# Patient Record
Sex: Female | Born: 1975 | Hispanic: Yes | Marital: Single | Smoking: Never smoker
Health system: Southern US, Community
[De-identification: ages and names within clinical notes are randomized; demographics above are authoritative.]

---

## 2013-12-26 ENCOUNTER — Emergency Department (HOSPITAL_COMMUNITY): Payer: Self-pay

## 2013-12-26 ENCOUNTER — Emergency Department (HOSPITAL_COMMUNITY)
Admission: EM | Admit: 2013-12-26 | Discharge: 2013-12-27 | Disposition: A | Discharge status: Home / Self Care | Payer: Self-pay | Attending: Emergency Medicine | Admitting: Emergency Medicine

## 2013-12-26 ENCOUNTER — Encounter (HOSPITAL_COMMUNITY): Payer: Self-pay

## 2013-12-26 DIAGNOSIS — R062 Wheezing: Secondary | ICD-10-CM

## 2013-12-26 DIAGNOSIS — J069 Acute upper respiratory infection, unspecified: Secondary | ICD-10-CM | POA: Insufficient documentation

## 2013-12-26 DIAGNOSIS — R079 Chest pain, unspecified: Secondary | ICD-10-CM

## 2013-12-26 LAB — CBC
HCT: 38.6 % (ref 36.0–46.0)
HEMOGLOBIN: 12.9 g/dL (ref 12.0–15.0)
MCH: 29.7 pg (ref 26.0–34.0)
MCHC: 33.4 g/dL (ref 30.0–36.0)
MCV: 88.9 fL (ref 78.0–100.0)
PLATELETS: 191 10*3/uL (ref 150–400)
RBC: 4.34 MIL/uL (ref 3.87–5.11)
RDW: 12.4 % (ref 11.5–15.5)
WBC: 8.3 10*3/uL (ref 4.0–10.5)

## 2013-12-26 LAB — BASIC METABOLIC PANEL
Anion gap: 19 — ABNORMAL HIGH (ref 5–15)
BUN: 6 mg/dL (ref 6–23)
CALCIUM: 9.4 mg/dL (ref 8.4–10.5)
CO2: 17 meq/L — AB (ref 19–32)
CREATININE: 0.39 mg/dL — AB (ref 0.50–1.10)
Chloride: 100 mEq/L (ref 96–112)
GFR calc Af Amer: >90 mL/min (ref >90)
GLUCOSE: 92 mg/dL (ref 70–99)
Potassium: 4 mEq/L (ref 3.7–5.3)
SODIUM: 136 meq/L — AB (ref 137–147)

## 2013-12-26 LAB — I-STAT TROPONIN, ED: TROPONIN I, POC: 0 ng/mL (ref 0.00–0.08)

## 2013-12-26 NOTE — ED Notes (Signed)
Pt presents with Left side chest pain radiating to Left arm, diaphoresis, SOB, and dizziness starting at 0700. Pt states it kept waking her up. Pt also c/o Left arm numbness, stroke screen negative in triage, no facial droop or arm drift noted, grips and strengths are equal bilaterally.

## 2013-12-27 LAB — I-STAT TROPONIN, ED: Troponin i, poc: 0 ng/mL (ref 0.00–0.08)

## 2013-12-27 MED ORDER — BENZONATATE 100 MG PO CAPS: 100.0000 mg | ORAL_CAPSULE | Freq: Three times a day (TID) | ORAL | Status: AC

## 2013-12-27 MED ORDER — BENZONATATE 100 MG PO CAPS
200.0000 mg | ORAL_CAPSULE | Freq: Once | ORAL | Status: AC
  Administered 2013-12-27: 200 mg via ORAL
  Filled 2013-12-27: qty 2

## 2013-12-27 MED ORDER — DIPHENHYDRAMINE HCL 25 MG PO CAPS
25.0000 mg | ORAL_CAPSULE | Freq: Once | ORAL | Status: AC
  Administered 2013-12-27: 25 mg via ORAL
  Filled 2013-12-27: qty 1

## 2013-12-27 MED ORDER — LEVALBUTEROL HCL 1.25 MG/0.5ML IN NEBU
2.5000 mg | INHALATION_SOLUTION | Freq: Once | RESPIRATORY_TRACT | Status: AC
  Administered 2013-12-27: 2.5 mg via RESPIRATORY_TRACT
  Filled 2013-12-27: qty 1

## 2013-12-27 MED ORDER — PREDNISONE 20 MG PO TABS: ORAL_TABLET | ORAL | Status: AC

## 2013-12-27 MED ORDER — LEVALBUTEROL TARTRATE 45 MCG/ACT IN AERO
2.0000 | INHALATION_SPRAY | Freq: Once | RESPIRATORY_TRACT | Status: AC
  Administered 2013-12-27: 2 via RESPIRATORY_TRACT
  Filled 2013-12-27: qty 15

## 2013-12-27 MED ORDER — PREDNISONE 20 MG PO TABS
60.0000 mg | ORAL_TABLET | Freq: Once | ORAL | Status: AC
  Administered 2013-12-27: 60 mg via ORAL
  Filled 2013-12-27: qty 3

## 2013-12-27 NOTE — ED Notes (Signed)
Neb in progress, tolerating, NAD, calm.

## 2013-12-27 NOTE — Discharge Instructions (Signed)
Asma, broncoespasmo agudo °(Asthma, Acute Bronchospasm) °El broncoespasmo agudo causado por el asma también se conoce como crisis de asma. Broncoespasmo significa que las vías respiratorias se han estrechado. La causa del estrechamiento es la inflamación y la constricción de los músculos de las vías respiratorias (bronquios) que se encuentran en los pulmones. Esto puede dificultar la respiración o provocarle sibilancias y tos. °CAUSAS °Los desencadenantes posibles son: °· La caspa que eliminan los animales de la piel, el pelo o las plumas de los animales. °· Los ácaros que se encuentran en el polvo de la casa. °· Cucarachas. °· El polen de los árboles o el césped. °· Moho. °· El humo del cigarrillo o del tabaco °· Sustancias contaminantes como el polvo, limpiadores hogareños, aerosoles (como los aerosoles para el cabello), vapores de pintura, sustancias químicas fuertes u olores intensos. °· El aire frío o cambios climáticos. El aire frío puede causar inflamación. El viento aumenta la cantidad de moho y polen del aire. °· Emociones fuertes, como llorar o reír intensamente. °· Estrés. °· Ciertos medicamentos como la aspirina o betabloqueantes. °· Los sulfitos que se encuentran en las comidas y bebidas como frutas secas y el vino. °· Enfermedades infecciosas o inflamatorias, como la gripe, el resfrío o la inflamación de las membranas nasales (rinitis). °· El reflujo gastroesofágico (ERGE). El reflujo gastroesofágico es una afección en la que los ácidos estomacales vuelven al esófago. °· Los ejercicios o actividades extenuantes. °SIGNOS Y SÍNTOMAS  °· Sibilancias. °· Tos intensa, especialmente por la noche. °· Opresión en el pecho. °· Falta de aire. °DIAGNÓSTICO  °El médico le hará una historia clínica y le hará un examen físico. Le indicarán radiografías o análisis de sangre para buscar otras causas de los síntomas u otras enfermedades que puedan desencadenar una crisis de asma.  °TRATAMIENTO  °El tratamiento está  dirigido a reducir la inflamación y abrir las vías respiratorias en los pulmones. La mayor parte de las crisis asmáticas se tratan con medicamentos por vía inhalatoria. Entre ellos se incluyen los medicamentos de alivio rápido o medicamentos de rescate (como los broncodilatadores) y los medicamentos de control (como los corticoides inhalados). Estos medicamentos se administran a través de un inhalador o de un nebulizador. Los corticoides sistémicos por vía oral o por vía intravenosa también se administran para reducir la inflamación cuando un ataque es moderado o grave. Los antibióticos se indican solo si hay infección bacteriana.  °INSTRUCCIONES PARA EL CUIDADO EN EL HOGAR  °· Reposo. °· Beba líquido en abundancia. Esto ayuda a diluir la mucosidad y a eliminarla fácilmente. Solo consuma productos con cafeína moderadamente y no consuma alcohol hasta que se haya recuperado de la enfermedad. °· No fume. Evite la exposición al humo de otros fumadores. °· Usted tiene un rol fundamental en mantener su buena salud. Evite la exposición a lo que le ocasiona los problemas respiratorios. °· Mantenga los medicamentos actualizados y al alcance. Siga cuidadosamente el plan de tratamiento del médico. °· Utilice los medicamentos tal como se le indicó. °· Cuando haya mucho polen o polución, mantenga las ventanas cerradas y use el aire acondicionado o vaya a lugares con aire acondicionado. °· El asma requiere atención médica exhaustiva. Concurra a los controles según las indicaciones. Si tiene un embarazo de más de 24 semanas y le han recetado medicamentos nuevos, coméntelo con su obstetra y cuál es su evolución. Concurra a las consultas de control con su médico según las indicaciones. °· Después de recuperarse de la crisis de asma, haga una cita con   el médico para conocer cómo puede reducir la probabilidad de futuros ataques. Si no cuenta con un médico para que controle su asma, haga una cita con un médico de atención primaria para  hablar de esta enfermedad. °SOLICITE ATENCIÓN MÉDICA DE INMEDIATO SI:  °· Empeora. °· Tiene dificultad para respirar. Si la dificultad es intensa comuníquese con el servicio de emergencias de su localidad (911 en los [place]). °· Siente dolor o molestias en el pecho. °· Tiene vómitos. °· No puede retener los líquidos. °· Elimina una expectoración verde, amarilla, amarronada o sanguinolenta. °· Tiene fiebre y los síntomas empeoran repentinamente. °· Presenta dificultad para tragar. °ASEGÚRESE DE QUE:  °· Comprende estas instrucciones. °· Controlará su afección. °· Recibirá ayuda de inmediato si no mejora o si empeora. °Document Released: 05/03/2008 Document Revised: 01/20/2013 °[employer]® Patient Information ©2015 [employer]. This information is not intended to replace advice given to you by your health care provider. Make sure you discuss any questions you have with your health care provider. ° °

## 2013-12-27 NOTE — ED Notes (Signed)
Neb complete, wheezing remains, breathing easier, alert, NAD, calm, no dyspnea noted, wants to go home, pending re-eval.

## 2013-12-27 NOTE — ED Provider Notes (Signed)
CSN: [id number] Arrival date & time 12/26/13 1955 History  This chart was scribed for Toy CookeyMegan Juanito Gonyer, MD by Annye AsaAnna Dorsett, ED Scribe. This patient was seen in room B15C/B15C and the patient's care was started at 12:19 AM.   Chief Complaint  Patient presents with  . Chest Pain   Patient is a 38 y.o. female presenting with shortness of breath. The history is provided by the patient and a relative.  Shortness of Breath Severity: Moderate Onset quality: Gradual Duration: 6 days Timing: Constant Progression: Worsening Chronicity: Recurrent Context: URI  Relieved by: Nothing Worsened by: Coughing and deep breathing Ineffective treatments: None tried Associated symptoms: chest pain, cough, ear pain and wheezing  Associated symptoms: no abdominal pain, no diaphoresis, no fever, no headaches, no neck pain, no sore throat and no vomiting    HPI Comments: Laurie Wilson is a 38 y.o. female who presents to the Emergency Department complaining of 6 days of worsening nonproductive cough and SOB. Laurie Wilson reports a subjective fever, beginning last night, and dizziness. When breathing deeply, Laurie Wilson has pain in her chest and back, and Laurie Wilson notes an "itchy" sensation in her ears, throat, chest. With cough, Laurie Wilson has a "coppery taste" in the back of her throat. Laurie Wilson denies nausea, vomiting, diarrhea, rhinorrhea, sore throat, sick contacts. Patient reports that Laurie Wilson took OTC cold meds for her cough without improvement. Laurie Wilson took Benadryl at 1800 tonight.   Her chest pain worsened at 1800 today, radiating down her left arm; it is described as constant and not dependent on her cough. Laurie Wilson reports a racing heart sensation: feels fast heart beats, feels her heart "stop" then start again.   Laurie Wilson reports prior experience with similar symptoms (April 2015). Laurie Wilson has previously utilized a relative's inhalers but noted "red, itchy spots" and lip swelling with use. Last use: April 2015.   Laurie Wilson denies any other  significant medical history. Patient denies smoking.   History reviewed. No pertinent past medical history. History reviewed. No pertinent past surgical history. History reviewed. No pertinent family history. History  Substance Use Topics  . Smoking status: Never Smoker   . Smokeless tobacco: Not on file  . Alcohol Use: No   OB History    No data available     Review of Systems  Constitutional: Negative for fever, chills, diaphoresis, activity change, appetite change and fatigue.  HENT: Positive for ear pain. Negative for congestion, facial swelling, rhinorrhea and sore throat.  Eyes: Negative for photophobia and discharge.  Respiratory: Positive for cough and wheezing. Negative for chest tightness and shortness of breath.  Cardiovascular: Positive for chest pain and palpitations. Negative for leg swelling.  Gastrointestinal: Negative for nausea, vomiting, abdominal pain and diarrhea.  Endocrine: Negative for polydipsia and polyuria.  Genitourinary: Negative for dysuria, frequency, difficulty urinating and pelvic pain.  Musculoskeletal: Negative for back pain, arthralgias, neck pain and neck stiffness.  Skin: Negative for color change and wound.  Allergic/Immunologic: Negative for immunocompromised state.  Neurological: Negative for facial asymmetry, weakness, numbness and headaches.  Hematological: Does not bruise/bleed easily.  Psychiatric/Behavioral: Negative for confusion and agitation.    Allergies  Review of patient's allergies indicates no known allergies.  Home Medications   Prior to Admission medications   Not on File   BP 108/56 mmHg  Pulse 79  Temp(Src) 98.3 F (36.8 C) (Oral)  Resp 12  SpO2 100%  LMP 12/26/2013 (Exact Date) Physical Exam  Constitutional: Laurie Wilson is oriented to person, place, and time. Laurie Wilson appears well-developed and well-nourished. No  distress.  HENT:  Head: Normocephalic.  Mouth/Throat: Oropharynx is clear and  moist.  Eyes: Pupils are equal, round, and reactive to light.  Neck: Neck supple.  Cardiovascular: Normal rate, regular rhythm and normal heart sounds.  Pulmonary/Chest: Effort normal. No respiratory distress. Laurie Wilson has wheezes (in all lobes).  Bronchospastic cough  Abdominal: Soft. Laurie Wilson exhibits no distension. There is no tenderness. There is no rebound and no guarding.  Musculoskeletal: Laurie Wilson exhibits no edema or tenderness.  Neurological: Laurie Wilson is alert and oriented to person, place, and time.  Skin: Skin is warm and dry.  Psychiatric: Laurie Wilson has a normal mood and affect.  Nursing note and vitals reviewed.   ED Course  Procedures   DIAGNOSTIC STUDIES: Oxygen Saturation is 99% on RA, normal by my interpretation.   COORDINATION OF CARE: 12:28 AM Discussed treatment plan with pt at bedside and pt agreed to plan.   Labs Review Labs Reviewed  BASIC METABOLIC PANEL - Abnormal; Notable for the following:    Sodium 136 (*)    CO2 17 (*)    Creatinine, Ser 0.39 (*)    Anion gap 19 (*)    All other components within normal limits  CBC  I-STAT TROPOININ, ED  I-STAT TROPOININ, ED    Imaging Review  Imaging Results (Last 48 hours)    Dg Chest 2 View  12/26/2013 CLINICAL DATA: Acute onset of left-sided chest pain, radiating to left arm. Diaphoresis, shortness of breath and dizziness. Initial encounter. Left arm numbness. EXAM: CHEST 2 VIEW COMPARISON: None. FINDINGS: The lungs are well-aerated and clear. There is no evidence of focal opacification, pleural effusion or pneumothorax. The heart is normal in size; the mediastinal contour is within normal limits. No acute osseous abnormalities are seen. IMPRESSION: No acute cardiopulmonary process seen. Electronically Signed By: Roanna RaiderJeffery Chang M.D. On: 12/26/2013 21:16      EKG Interpretation   Date/Time: Saturday December 26 2013 19:58:58 EST Ventricular Rate: 73 PR Interval: 172 QRS Duration:  78 QT Interval: 406 QTC Calculation: 447 R Axis: 33 Text Interpretation: Normal sinus rhythm Normal ECG No prior for  comparison Confirmed by Fabion Gatson MD, Jarelis Ehlert ([id number]) on 12/26/2013 11:52:01  PM    MDM   Final diagnoses:  Chest pain    Pt is a 38 y.o. female with Pmhx as above who presents with barking, dry cough, SOB, Itchy throat and ears, L sided CP since 0700 w/ radiation to left arm. On PE, Laurie Wilson is in NAD, and bronchospastic cough and diffuse wheezing throughout. Laurie Wilson appears to have an undocumented allergy to albuterol  Delta trop neg, no risk factors for PE or ACS. EKG w/o ischemic changes. CXR clear. I suspect viral URI w/ bronchospasm is cause of symptoms. Pt given PO steroids, Xopenex neb x2, tessalon with improvement and asked ot be d/c'd home. Will give rx for steroid burst, tessalon, xopenex MDI given in dept.   1. Viral URI   2. Chest pain   3. Wheezing      I personally performed the services described in this documentation, which was scribed in my presence. The recorded information has been reviewed and is accurate.       Toy CookeyMegan Tremont Gavitt, MD 12/28/13 2217

## 2013-12-27 NOTE — ED Notes (Signed)
Pt discharged home with all belongings, pt alert, oriented, and ambulatory upon discharge. 2 new RX prescribed, pt verbalizes understanding of d/c instructions. Pt drove self home, no narcotics given in ED by this RN

## 2016-02-18 IMAGING — DX DG CHEST 2V
2 series · 2 of 2 positions shown · non-contrast
Comparison: None.

CLINICAL DATA: Acute onset of left-sided chest pain, radiating to
left arm. Diaphoresis, shortness of breath and dizziness. Initial
encounter. Left arm numbness.

EXAM:
CHEST  2 VIEW

[chest pa]
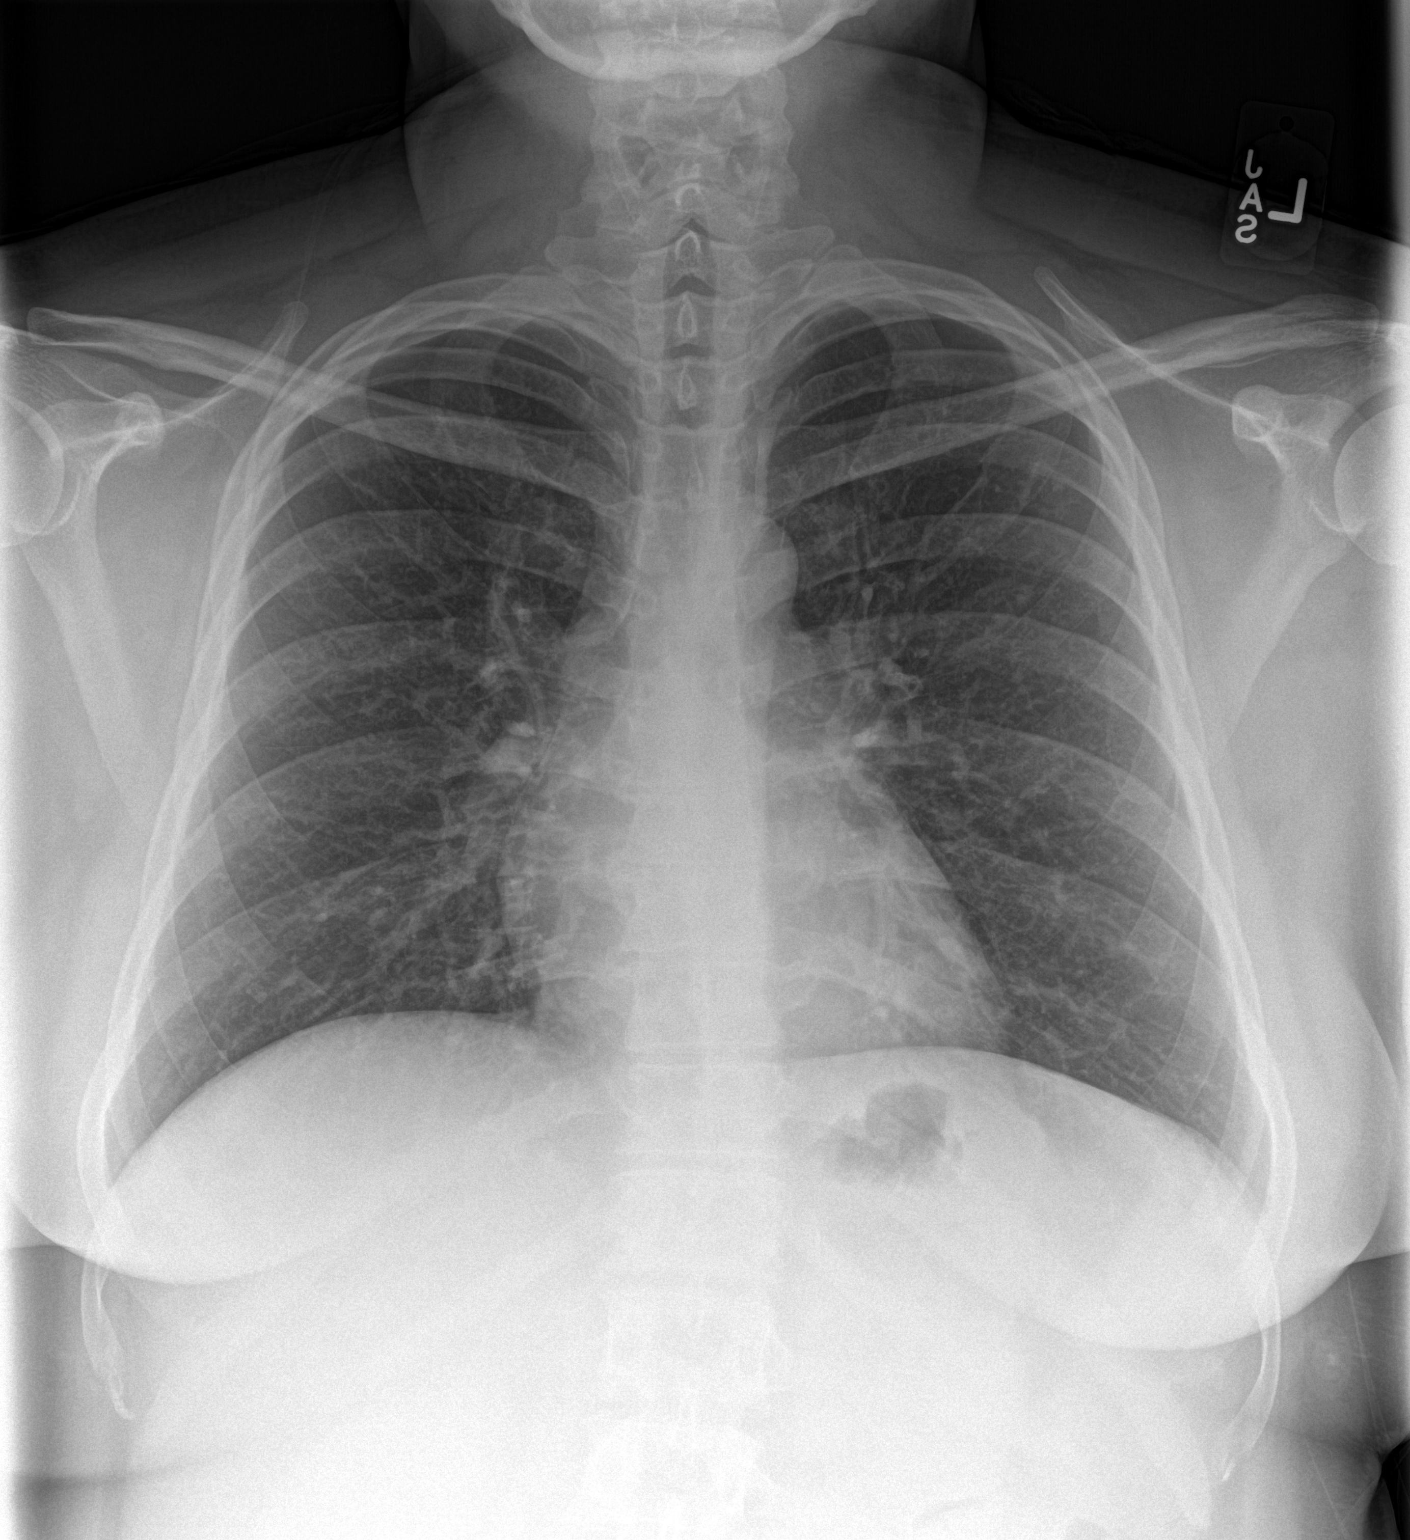

[chest lat]
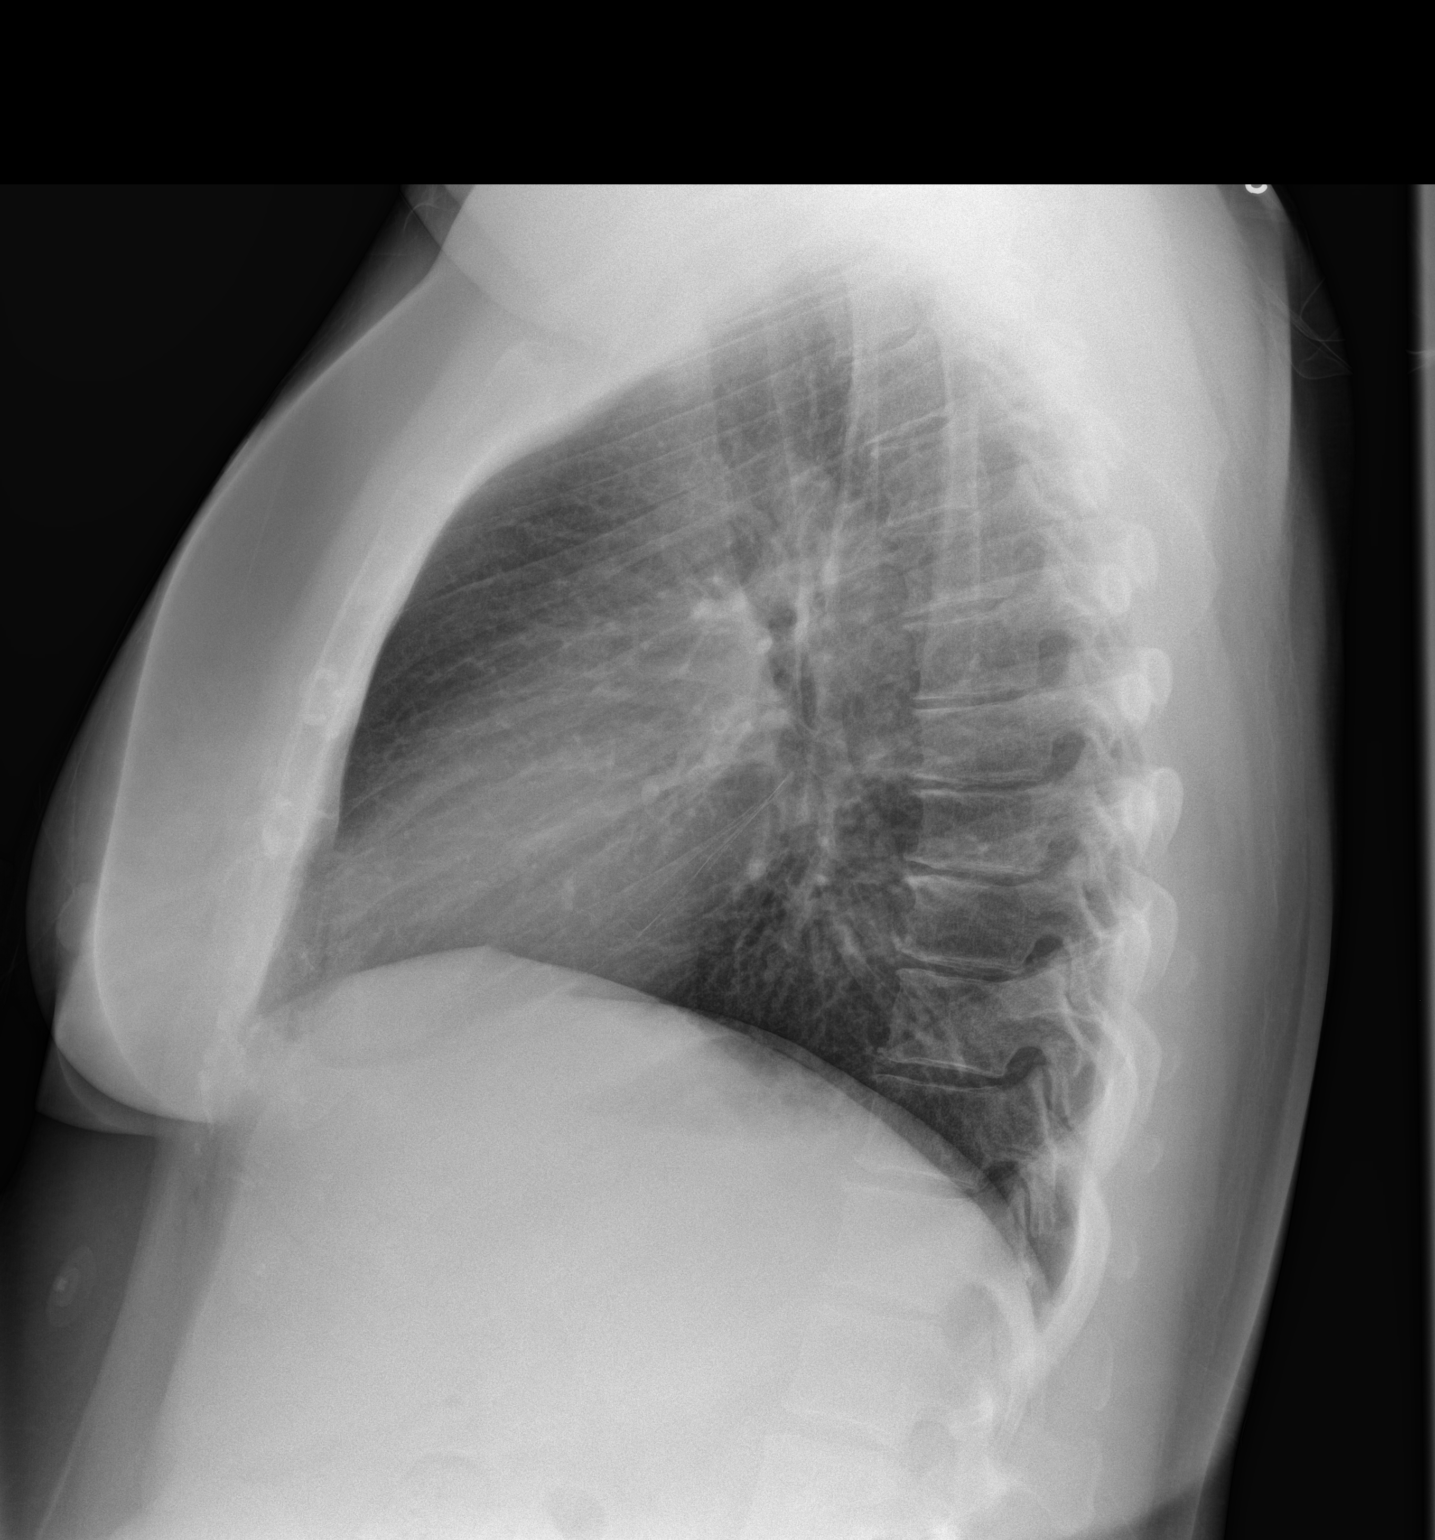

[2 of 2 positions shown; findings below may reference images not displayed]

FINDINGS: The lungs are well-aerated and clear. There is no evidence of focal
opacification, pleural effusion or pneumothorax.

The heart is normal in size; the mediastinal contour is within
normal limits. No acute osseous abnormalities are seen.
IMPRESSION: No acute cardiopulmonary process seen.
